# Patient Record
Sex: Female | Born: 1995 | Race: White | Hispanic: No | Marital: Single
Health system: Southern US, Community
[De-identification: ages and names within clinical notes are randomized; demographics above are authoritative.]

---

## 2017-08-19 ENCOUNTER — Emergency Department: Payer: Medicaid Other

## 2017-08-19 ENCOUNTER — Emergency Department
Admission: EM | Admit: 2017-08-19 | Discharge: 2017-08-19 | Disposition: A | Discharge status: Home / Self Care | Payer: Medicaid Other | Attending: Emergency Medicine | Admitting: Emergency Medicine

## 2017-08-19 ENCOUNTER — Other Ambulatory Visit: Payer: Self-pay

## 2017-08-19 DIAGNOSIS — W19XXXA Unspecified fall, initial encounter: Secondary | ICD-10-CM

## 2017-08-19 DIAGNOSIS — Y9259 Other trade areas as the place of occurrence of the external cause: Secondary | ICD-10-CM | POA: Diagnosis not present

## 2017-08-19 DIAGNOSIS — Y9301 Activity, walking, marching and hiking: Secondary | ICD-10-CM | POA: Insufficient documentation

## 2017-08-19 DIAGNOSIS — S0990XA Unspecified injury of head, initial encounter: Secondary | ICD-10-CM | POA: Insufficient documentation

## 2017-08-19 DIAGNOSIS — M542 Cervicalgia: Secondary | ICD-10-CM | POA: Insufficient documentation

## 2017-08-19 DIAGNOSIS — W010XXA Fall on same level from slipping, tripping and stumbling without subsequent striking against object, initial encounter: Secondary | ICD-10-CM | POA: Diagnosis not present

## 2017-08-19 DIAGNOSIS — Y999 Unspecified external cause status: Secondary | ICD-10-CM | POA: Insufficient documentation

## 2017-08-19 LAB — POCT PREGNANCY, URINE: PREG TEST UR: NEGATIVE

## 2017-08-19 NOTE — ED Provider Notes (Signed)
[facility] Emergency Department Provider Note   ____________________________________________   First MD Initiated Contact with Patient 08/19/17 2055     (approximate)  I have reviewed the triage vital signs and the nursing notes.   HISTORY  Chief Complaint Fall    HPI Chelsea Sanders is a 22 y.o. female Patient [place] the mall wearing flip[place]flops after a pedicurist slipped in water and fell straight back hit her head. Reportedly her head bounced. She did not pass out she is complaining of pain at about C6 point tenderness. No numbness or weakness no other medical problems although her old records say history of Downs syndrome   History reviewed. No pertinent past medical history.  There are no active problems to display for this patient.     Prior to Admission medications   Not on File    Allergies Patient has no allergy information on record.  History reviewed. No pertinent family history.  Social History Social History   Tobacco Use  . Smoking status: Not on file  Substance Use Topics  . Alcohol use: Not on file  . Drug use: Not on file    Review of Systems  Constitutional: No fever/chills Eyes: No visual changes. ENT: No sore throat. Cardiovascular: Denies chest pain. Respiratory: Denies shortness of breath. Gastrointestinal: No abdominal pain.  No nausea, no vomiting.  No diarrhea.  No constipation. Genitourinary: Negative for dysuria. Musculoskeletal: Negative for back pain. Skin: Negative for rash. Neurological: Negative for headaches, focal weakness or numbness.   ____________________________________________   PHYSICAL EXAM:  VITAL SIGNS: ED Triage Vitals  Enc Vitals Group     BP 08/19/17 2059 109/90     Pulse Rate 08/19/17 2059 95     Resp 08/19/17 2059 (!) 21     Temp 08/19/17 2059 98.4 F (36.9 C)     Temp Source 08/19/17 2059 Oral     SpO2 08/19/17 2059 100 %     Weight --      Height --      Head  Circumference --      Peak Flow --      Pain Score 08/19/17 2100 1     Pain Loc --      Pain Edu? --      Excl. in GC? --     Constitutional: Alert and oriented. Well appearing and in no acute distress. Eyes: Conjunctivae are normal. PERRL. EOMI. Head: Atraumatic. Nose: No congestion/rhinnorhea. Mouth/Throat: Mucous membranes are moist.  Oropharynx non-erythematous. Neck: No stridor.  cervical spine tenderness to palpation at about C6 }Cardiovascular: Normal rate, regular rhythm. Grossly normal heart sounds.  Good peripheral circulation. Respiratory: Normal respiratory effort.  No retractions. Lungs CTAB. Gastrointestinal: Soft and nontender. No distention. No abdominal bruits. No CVA tenderness. Musculoskeletal: No lower extremity tenderness nor edema.  No joint effusions. Neurologic:  Normal speech and language. No gross focal neurologic deficits are appreciated. Skin:  Skin is warm, dry and intact. No rash noted. Psychiatric: Mood and affect are normal. Speech and behavior are normal.  ____________________________________________   LABS (all labs ordered are listed, but only abnormal results are displayed)  Labs Reviewed  POC URINE PREG, ED  POCT PREGNANCY, URINE   ____________________________________________  EKG  _____________________________________  RADIOLOGY  ED MD interpretation: CT of the head and neck was read by radiologist some mild degenerative joint disease in the neck and no fracture is nothing else.  Official radiology report(s): Ct Head Wo Contrast  Result Date: 08/19/2017  CLINICAL DATA:  22 y/o  F; fall with head injury. EXAM: CT HEAD WITHOUT CONTRAST CT CERVICAL SPINE WITHOUT CONTRAST TECHNIQUE: Multidetector CT imaging of the head and cervical spine was performed following the standard protocol without intravenous contrast. Multiplanar CT image reconstructions of the cervical spine were also generated. COMPARISON:  None. FINDINGS: CT HEAD FINDINGS  Brain: No evidence of acute infarction, hemorrhage, hydrocephalus, extra-axial collection or mass lesion/mass effect. Vascular: No hyperdense vessel or unexpected calcification. Skull: Normal. Negative for fracture or focal lesion. Sinuses/Orbits: No acute finding. Other: None. CT CERVICAL SPINE FINDINGS Alignment: Mild reversal of cervical curvature.  No listhesis. Skull base and vertebrae: No acute fracture. No primary bone lesion or focal pathologic process. 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 Soft tissues and spinal canal: No prevertebral fluid or swelling. No visible canal hematoma. Disc levels: Mild discogenic degenerative changes at the C5-C7 levels. Upper chest: Negative. Other: Negative. IMPRESSION: 1. Negative CT of the head. 2. No acute fracture or dislocation of cervical spine. 3. Mild discogenic degenerative changes of the cervical spine at C5-C7 levels. Electronically Signed   By: Mitzi Hansen M.D.   On: 08/19/2017 22:43   Ct Cervical Spine Wo Contrast  Result Date: 08/19/2017 CLINICAL DATA:  22 y/o  F; fall with head injury. EXAM: CT HEAD WITHOUT CONTRAST CT CERVICAL SPINE WITHOUT CONTRAST TECHNIQUE: Multidetector CT imaging of the head and cervical spine was performed following the standard protocol without intravenous contrast. Multiplanar CT image reconstructions of the cervical spine were also generated. COMPARISON:  None. FINDINGS: CT HEAD FINDINGS Brain: No evidence of acute infarction, hemorrhage, hydrocephalus, extra-axial collection or mass lesion/mass effect. Vascular: No hyperdense vessel or unexpected calcification. Skull: Normal. Negative for fracture or focal lesion. Sinuses/Orbits: No acute finding. Other: None. CT CERVICAL SPINE FINDINGS Alignment: Mild reversal of cervical curvature.  No listhesis. Skull base and vertebrae: No acute fracture. No primary bone lesion or focal pathologic process. Incomplete fusion of the  anterior and posterior arcs of C1 on a congenital basis. Soft tissues and spinal canal: No prevertebral fluid or swelling. No visible canal hematoma. Disc levels: Mild discogenic degenerative changes at the C5-C7 levels. Upper chest: Negative. Other: Negative. IMPRESSION: 1. Negative CT of the head. 2. No acute fracture or dislocation of cervical spine. 3. Mild discogenic degenerative changes of the cervical spine at C5-C7 levels. Electronically Signed   By: Mitzi Hansen M.D.   On: 08/19/2017 22:43    ____________________________________________   PROCEDURES  Procedure(s) performed:   Procedures  Critical Care performed:  ____________________________________________   INITIAL IMPRESSION / ASSESSMENT AND PLAN / ED COURSE on discharge patient does not have any further neck pain on palpation she is moving her neck well no focal neurological deficits no weakness or numbness. I will let her go. She will return for any problems.          ____________________________________________   FINAL CLINICAL IMPRESSION(S) / ED DIAGNOSES  Final diagnoses:  Fall, initial encounter     ED Discharge Orders    None       Note:  This document was prepared using Dragon voice recognition software and may include unintentional dictation errors.    Arnaldo Natal, MD 08/19/17 2252

## 2017-08-19 NOTE — Discharge Instructions (Signed)
For mild pain you can use Tylenol or Advil, if anything  hurts a lot please return. If there is any numbness or weakness please return. I would probably take it easy tomorrow unless she feels perfectly normal.

## 2017-08-19 NOTE — ED Triage Notes (Signed)
[place] in water and falling straight back. Pt did hit her head but did not have any LOC. Pt is A&O x4 at this time. Pt has hx of down syndrome.

## 2019-02-19 IMAGING — CT CT CERVICAL SPINE W/O CM
4 of 7 series · 14 of 33 positions shown, 15 images · non-contrast
Comparison: None.

CLINICAL DATA: 21 y/o  F; fall with head injury.

EXAM:
CT HEAD WITHOUT CONTRAST
CT CERVICAL SPINE WITHOUT CONTRAST
TECHNIQUE: Multidetector CT imaging of the head and cervical spine was
performed following the standard protocol without intravenous
contrast. Multiplanar CT image reconstructions of the cervical spine
were also generated.

[Series 4: coronal soft tissue · coronal · 0.27mm/px · 2 of 59 slices shown]
[im 20/59  bone]
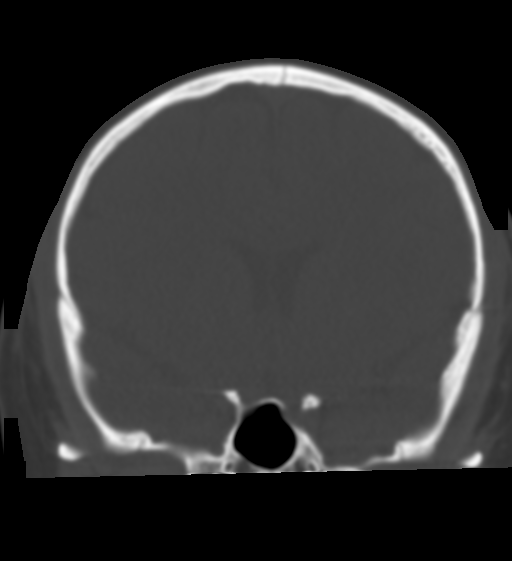
[im 39/59  bone]
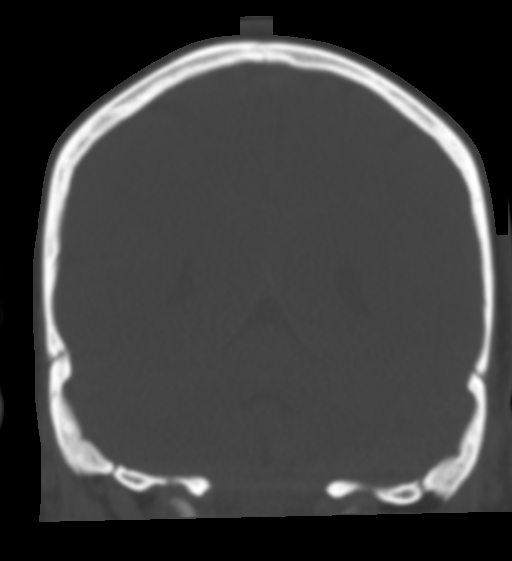

[Series 7: c spine soft · axial · 0.30mm/px · z∈[+246,+336]mm · 4 of 76 slices shown]
[im 16/76  soft-tissue]
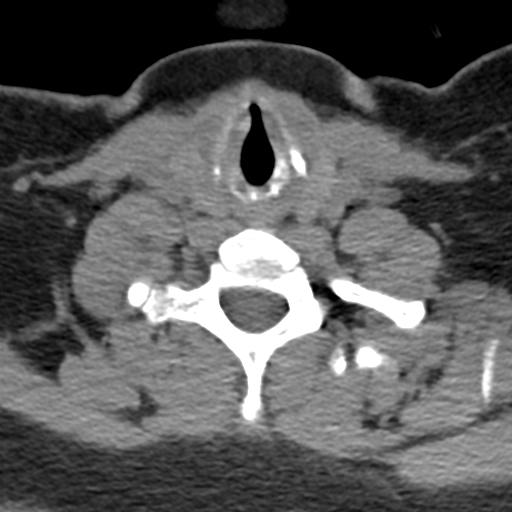
[im 31/76  soft-tissue]
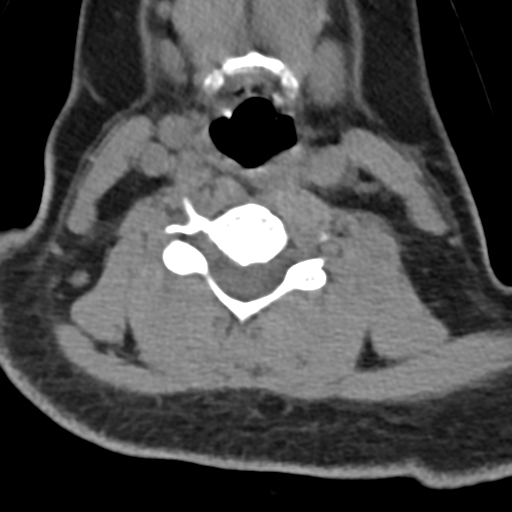
[im 46/76  soft-tissue]
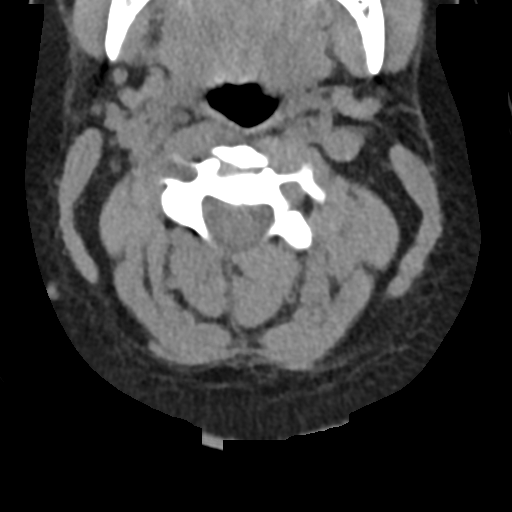
[im 61/76  soft-tissue]
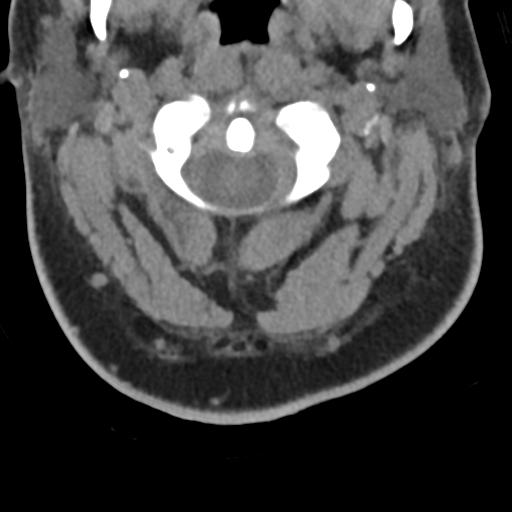

[Series 8: sagittal bone · sagittal · 0.23mm/px · 4 of 42 slices shown]
[im 9/42  bone]
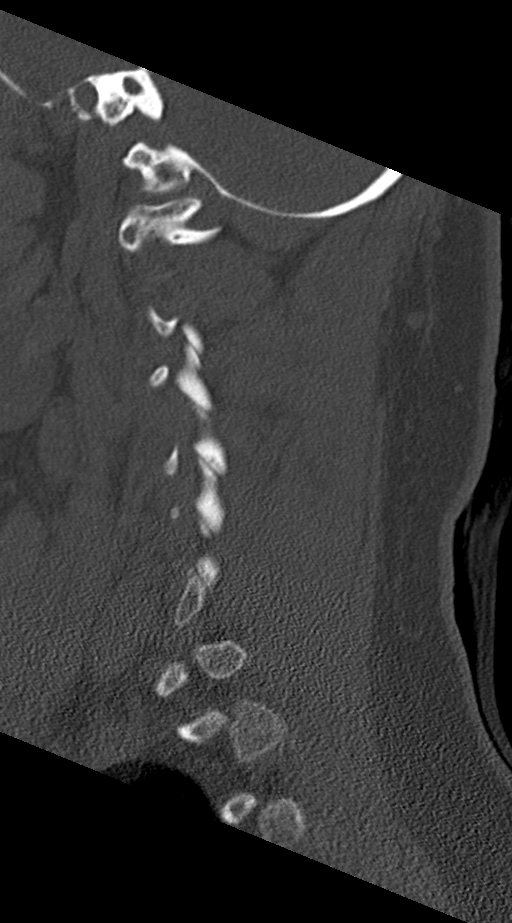
[im 17/42  bone]
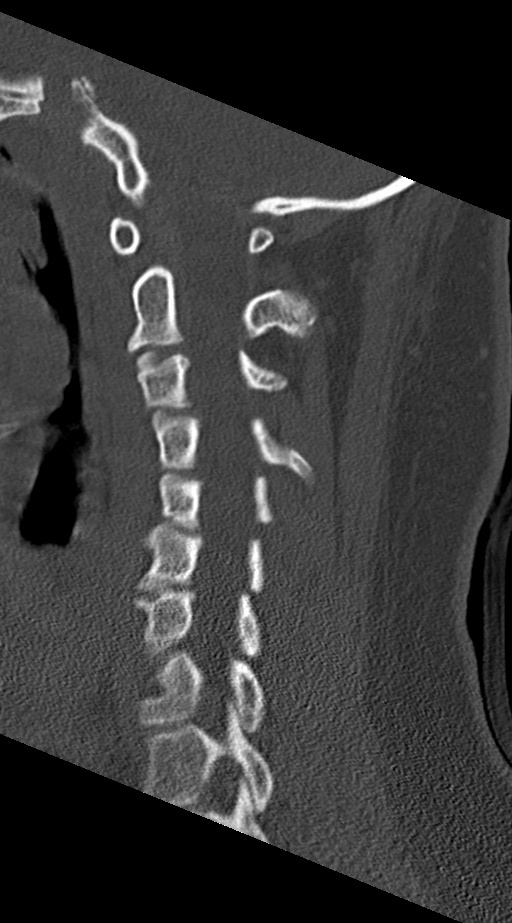
[im 25/42  bone]
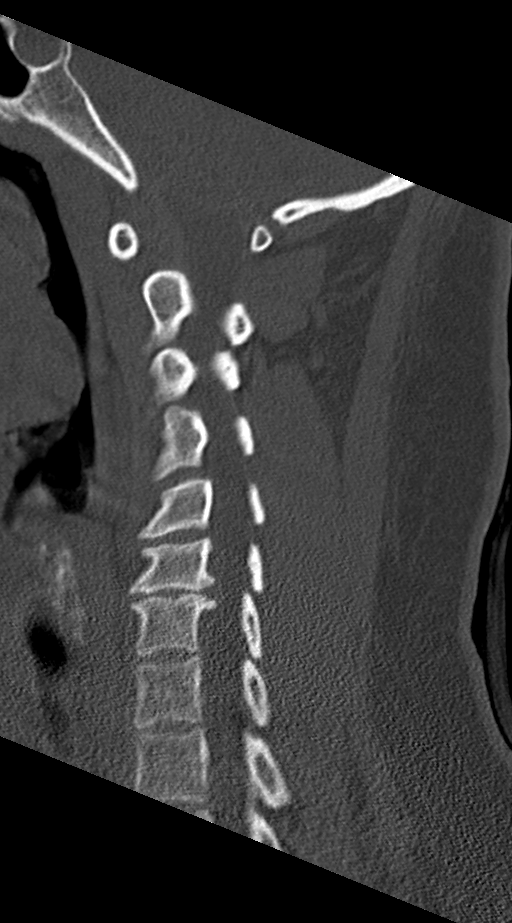
[im 33/42  bone]
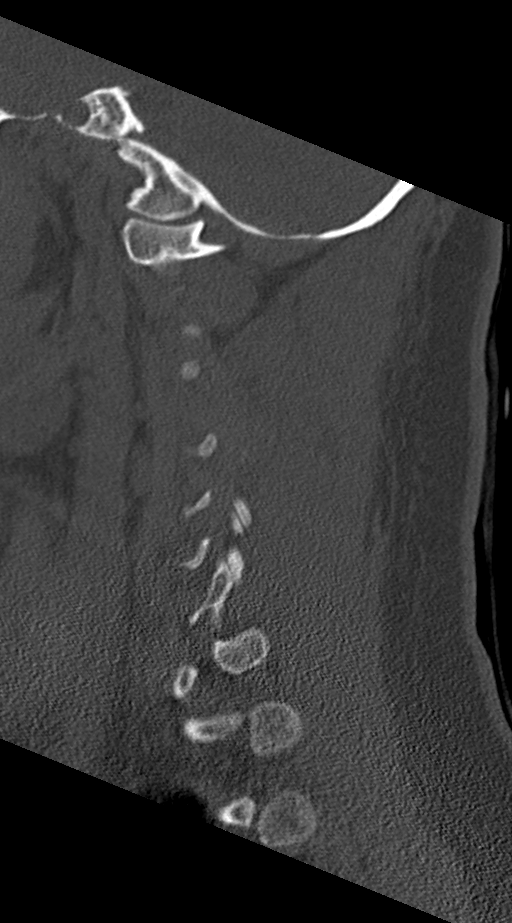

[Series 10: orthogonal bone · axial · 0.20mm/px · z∈[+222,+311]mm · 4 of 81 slices shown, 5 images]
[im 17/81  soft-tissue]
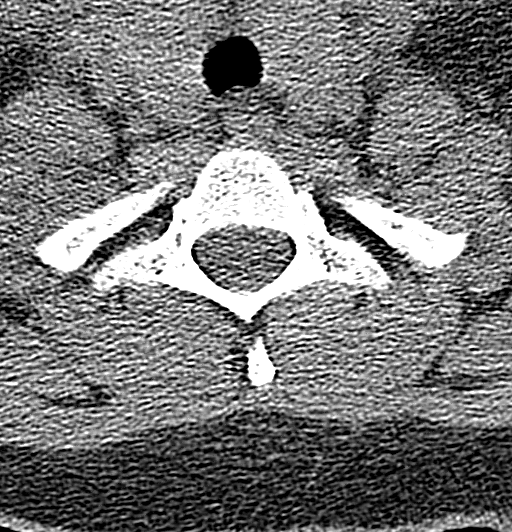
[im 17/81  bone]
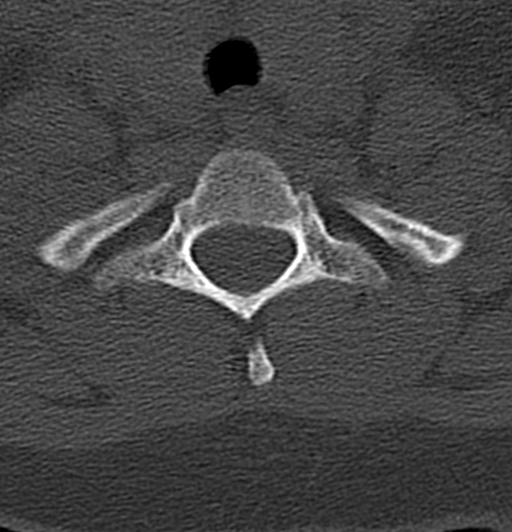
[im 33/81  bone]
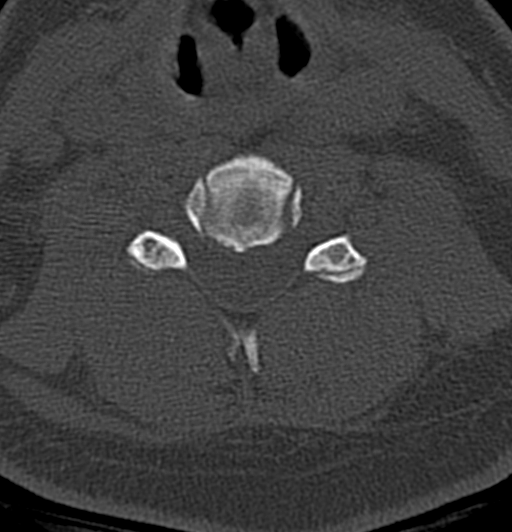
[im 49/81  bone]
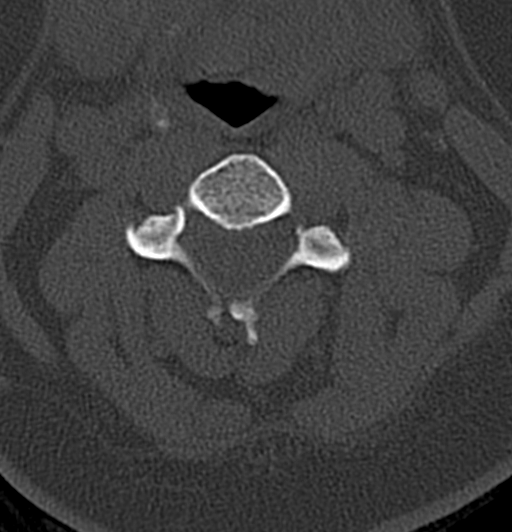
[im 65/81  bone]
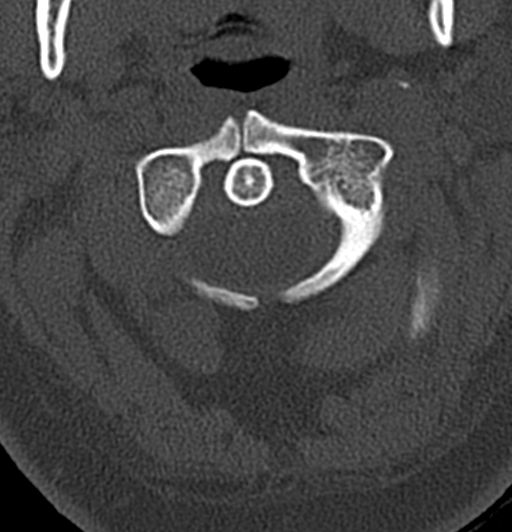

[14 of 33 positions shown; findings below may reference images not displayed]

FINDINGS: CT HEAD FINDINGS

Brain: No evidence of acute infarction, hemorrhage, hydrocephalus,
extra-axial collection or mass lesion/mass effect.

Vascular: No hyperdense vessel or unexpected calcification.

Skull: Normal. Negative for fracture or focal lesion.

Sinuses/Orbits: No acute finding.

Other: None.

CT CERVICAL SPINE FINDINGS

Alignment: Mild reversal of cervical curvature.  No listhesis.

Skull base and vertebrae: No acute fracture. No primary bone lesion
or focal pathologic process. Incomplete fusion of the anterior and
posterior arcs of C1 on a congenital basis.

Soft tissues and spinal canal: No prevertebral fluid or swelling. No
visible canal hematoma.

Disc levels: Mild discogenic degenerative changes at the C5-C7
levels.

Upper chest: Negative.

Other: Negative.
IMPRESSION: 1. Negative CT of the head.
2. No acute fracture or dislocation of cervical spine.
3. Mild discogenic degenerative changes of the cervical spine at
C5-C7 levels.

By: Klpigbb Moolman M.D.
